# Patient Record
Sex: Male | Born: 1935 | Race: White | Hispanic: No | Marital: Married | Smoking: Never smoker
Health system: Southern US, Community
[De-identification: ages and names within clinical notes are randomized; demographics above are authoritative.]

## PROBLEM LIST (undated history)

## (undated) DIAGNOSIS — C4492 Squamous cell carcinoma of skin, unspecified: Secondary | ICD-10-CM

## (undated) DIAGNOSIS — D229 Melanocytic nevi, unspecified: Secondary | ICD-10-CM

## (undated) DIAGNOSIS — C4491 Basal cell carcinoma of skin, unspecified: Secondary | ICD-10-CM

---
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
## 2008-08-03 ENCOUNTER — Emergency Department (HOSPITAL_BASED_OUTPATIENT_CLINIC_OR_DEPARTMENT_OTHER): Admission: EM | Admit: 2008-08-03 | Discharge: 2008-08-03 | Payer: Self-pay | Admitting: Emergency Medicine

## 2009-02-16 ENCOUNTER — Emergency Department (HOSPITAL_BASED_OUTPATIENT_CLINIC_OR_DEPARTMENT_OTHER): Admission: EM | Admit: 2009-02-16 | Discharge: 2009-02-16 | Payer: Self-pay | Admitting: Emergency Medicine

## 2009-06-30 ENCOUNTER — Ambulatory Visit: Payer: Self-pay | Admitting: Diagnostic Radiology

## 2009-06-30 ENCOUNTER — Emergency Department (HOSPITAL_BASED_OUTPATIENT_CLINIC_OR_DEPARTMENT_OTHER): Admission: EM | Admit: 2009-06-30 | Discharge: 2009-06-30 | Payer: Self-pay | Admitting: Emergency Medicine

## 2009-09-30 ENCOUNTER — Emergency Department (HOSPITAL_BASED_OUTPATIENT_CLINIC_OR_DEPARTMENT_OTHER): Admission: EM | Admit: 2009-09-30 | Discharge: 2009-09-30 | Payer: Self-pay | Admitting: Emergency Medicine

## 2009-09-30 ENCOUNTER — Ambulatory Visit: Payer: Self-pay | Admitting: Radiology

## 2010-06-17 LAB — CBC
HCT: 37 % — ABNORMAL LOW (ref 39.0–52.0)
MCHC: 33.7 g/dL (ref 30.0–36.0)
MCV: 89.1 fL (ref 78.0–100.0)
RBC: 4.15 MIL/uL — ABNORMAL LOW (ref 4.22–5.81)
RDW: 13.6 % (ref 11.5–15.5)
WBC: 4.9 10*3/uL (ref 4.0–10.5)

## 2010-06-17 LAB — DIFFERENTIAL
Basophils Relative: 1 % (ref 0–1)
Eosinophils Absolute: 0.1 10*3/uL (ref 0.0–0.7)
Eosinophils Relative: 1 % (ref 0–5)
Lymphocytes Relative: 23 % (ref 12–46)
Monocytes Relative: 16 % — ABNORMAL HIGH (ref 3–12)

## 2010-06-17 LAB — PROTIME-INR: INR: 2.09 — ABNORMAL HIGH (ref 0.00–1.49)

## 2010-06-20 LAB — CBC
HCT: 39.3 % (ref 39.0–52.0)
MCHC: 33.4 g/dL (ref 30.0–36.0)
Platelets: 235 10*3/uL (ref 150–400)
RBC: 4.25 MIL/uL (ref 4.22–5.81)
WBC: 3.4 10*3/uL — ABNORMAL LOW (ref 4.0–10.5)

## 2010-06-20 LAB — COMPREHENSIVE METABOLIC PANEL
ALT: 19 U/L (ref 0–53)
Alkaline Phosphatase: 82 U/L (ref 39–117)
BUN: 24 mg/dL — ABNORMAL HIGH (ref 6–23)
CO2: 28 mEq/L (ref 19–32)
Creatinine, Ser: 1.2 mg/dL (ref 0.4–1.5)
GFR calc Af Amer: >60 mL/min (ref >60)
GFR calc non Af Amer: 60 mL/min — ABNORMAL LOW (ref >60)
Glucose, Bld: 112 mg/dL — ABNORMAL HIGH (ref 70–99)
Total Protein: 6.7 g/dL (ref 6.0–8.3)

## 2010-06-20 LAB — URINALYSIS, ROUTINE W REFLEX MICROSCOPIC
Nitrite: NEGATIVE
Protein, ur: NEGATIVE mg/dL
Urobilinogen, UA: 0.2 mg/dL (ref 0.0–1.0)

## 2010-06-20 LAB — DIFFERENTIAL: Monocytes Absolute: 0.5 10*3/uL (ref 0.1–1.0)

## 2010-06-20 LAB — URINE MICROSCOPIC-ADD ON

## 2010-07-10 LAB — DIFFERENTIAL
Basophils Absolute: 0.1 10*3/uL (ref 0.0–0.1)
Eosinophils Absolute: 0.1 10*3/uL (ref 0.0–0.7)
Lymphs Abs: 0.9 10*3/uL (ref 0.7–4.0)
Monocytes Relative: 11 % (ref 3–12)
Neutro Abs: 3.9 10*3/uL (ref 1.7–7.7)

## 2010-07-10 LAB — POCT CARDIAC MARKERS
CKMB, poc: 6.2 ng/mL (ref 1.0–8.0)
Troponin i, poc: <0.05 ng/mL (ref 0.00–0.09)

## 2010-07-10 LAB — BASIC METABOLIC PANEL
CO2: 28 mEq/L (ref 19–32)
Calcium: 9 mg/dL (ref 8.4–10.5)
Creatinine, Ser: 1 mg/dL (ref 0.4–1.5)
GFR calc Af Amer: >60 mL/min (ref >60)
GFR calc non Af Amer: >60 mL/min (ref >60)
Glucose, Bld: 133 mg/dL — ABNORMAL HIGH (ref 70–99)
Potassium: 3.9 mEq/L (ref 3.5–5.1)
Sodium: 141 mEq/L (ref 135–145)

## 2010-07-10 LAB — MAGNESIUM: Magnesium: 2 mg/dL (ref 1.5–2.5)

## 2010-07-10 LAB — CBC
Hemoglobin: 16 g/dL (ref 13.0–17.0)
WBC: 5.6 10*3/uL (ref 4.0–10.5)

## 2011-08-05 DIAGNOSIS — C4491 Basal cell carcinoma of skin, unspecified: Secondary | ICD-10-CM

## 2011-08-05 DIAGNOSIS — D229 Melanocytic nevi, unspecified: Secondary | ICD-10-CM

## 2011-08-05 DIAGNOSIS — C4492 Squamous cell carcinoma of skin, unspecified: Secondary | ICD-10-CM

## 2011-08-05 HISTORY — DX: Squamous cell carcinoma of skin, unspecified: C44.92

## 2011-08-05 HISTORY — DX: Basal cell carcinoma of skin, unspecified: C44.91

## 2011-08-05 HISTORY — DX: Melanocytic nevi, unspecified: D22.9

## 2014-07-18 DIAGNOSIS — C4491 Basal cell carcinoma of skin, unspecified: Secondary | ICD-10-CM

## 2014-07-18 HISTORY — DX: Basal cell carcinoma of skin, unspecified: C44.91

## 2014-08-10 DIAGNOSIS — C4491 Basal cell carcinoma of skin, unspecified: Secondary | ICD-10-CM

## 2014-08-10 HISTORY — DX: Basal cell carcinoma of skin, unspecified: C44.91

## 2015-04-24 DIAGNOSIS — C4491 Basal cell carcinoma of skin, unspecified: Secondary | ICD-10-CM

## 2015-04-24 HISTORY — DX: Basal cell carcinoma of skin, unspecified: C44.91

## 2015-08-23 DIAGNOSIS — C4491 Basal cell carcinoma of skin, unspecified: Secondary | ICD-10-CM

## 2015-08-23 HISTORY — DX: Basal cell carcinoma of skin, unspecified: C44.91

## 2018-10-26 ENCOUNTER — Encounter: Payer: Self-pay | Admitting: *Deleted

## 2020-10-13 ENCOUNTER — Emergency Department (HOSPITAL_BASED_OUTPATIENT_CLINIC_OR_DEPARTMENT_OTHER)
Admission: EM | Admit: 2020-10-13 | Discharge: 2020-10-13 | Disposition: A | Discharge status: Home / Self Care | Payer: Medicare Other | Attending: Emergency Medicine | Admitting: Emergency Medicine

## 2020-10-13 ENCOUNTER — Emergency Department (HOSPITAL_BASED_OUTPATIENT_CLINIC_OR_DEPARTMENT_OTHER): Payer: Medicare Other

## 2020-10-13 ENCOUNTER — Encounter (HOSPITAL_BASED_OUTPATIENT_CLINIC_OR_DEPARTMENT_OTHER): Payer: Self-pay | Admitting: *Deleted

## 2020-10-13 ENCOUNTER — Other Ambulatory Visit: Payer: Self-pay

## 2020-10-13 DIAGNOSIS — Z85828 Personal history of other malignant neoplasm of skin: Secondary | ICD-10-CM | POA: Insufficient documentation

## 2020-10-13 DIAGNOSIS — R112 Nausea with vomiting, unspecified: Secondary | ICD-10-CM | POA: Diagnosis present

## 2020-10-13 DIAGNOSIS — R42 Dizziness and giddiness: Secondary | ICD-10-CM | POA: Diagnosis not present

## 2020-10-13 LAB — COMPREHENSIVE METABOLIC PANEL
ALT: 30 U/L (ref 0–44)
AST: 32 U/L (ref 15–41)
Albumin: 4.2 g/dL (ref 3.5–5.0)
Alkaline Phosphatase: 84 U/L (ref 38–126)
Anion gap: 10 (ref 5–15)
BUN: 18 mg/dL (ref 8–23)
CO2: 30 mmol/L (ref 22–32)
Calcium: 8.6 mg/dL — ABNORMAL LOW (ref 8.9–10.3)
Chloride: 93 mmol/L — ABNORMAL LOW (ref 98–111)
Creatinine, Ser: 0.93 mg/dL (ref 0.61–1.24)
GFR, Estimated: >60 mL/min (ref >60)
Glucose, Bld: 137 mg/dL — ABNORMAL HIGH (ref 70–99)
Potassium: 3.3 mmol/L — ABNORMAL LOW (ref 3.5–5.1)
Sodium: 133 mmol/L — ABNORMAL LOW (ref 135–145)
Total Bilirubin: 0.4 mg/dL (ref 0.3–1.2)
Total Protein: 7.3 g/dL (ref 6.5–8.1)

## 2020-10-13 LAB — URINALYSIS, ROUTINE W REFLEX MICROSCOPIC
Bilirubin Urine: NEGATIVE
Glucose, UA: NEGATIVE mg/dL
Ketones, ur: NEGATIVE mg/dL
Leukocytes,Ua: NEGATIVE
Nitrite: NEGATIVE
Protein, ur: NEGATIVE mg/dL
Specific Gravity, Urine: 1.015 (ref 1.005–1.030)
pH: 8 (ref 5.0–8.0)

## 2020-10-13 LAB — CBC WITH DIFFERENTIAL/PLATELET
Abs Immature Granulocytes: 0 10*3/uL (ref 0.00–0.07)
Basophils Absolute: 0 10*3/uL (ref 0.0–0.1)
Basophils Relative: 1 %
Eosinophils Absolute: 0 10*3/uL (ref 0.0–0.5)
Eosinophils Relative: 1 %
HCT: 42 % (ref 39.0–52.0)
Hemoglobin: 14.8 g/dL (ref 13.0–17.0)
Immature Granulocytes: 0 %
Lymphocytes Relative: 15 %
Lymphs Abs: 0.8 10*3/uL (ref 0.7–4.0)
MCH: 31.5 pg (ref 26.0–34.0)
MCHC: 35.2 g/dL (ref 30.0–36.0)
MCV: 89.4 fL (ref 80.0–100.0)
Monocytes Absolute: 0.8 10*3/uL (ref 0.1–1.0)
Monocytes Relative: 14 %
Neutro Abs: 3.7 10*3/uL (ref 1.7–7.7)
Neutrophils Relative %: 69 %
Platelets: 233 10*3/uL (ref 150–400)
RBC: 4.7 MIL/uL (ref 4.22–5.81)
RDW: 12.5 % (ref 11.5–15.5)
WBC: 5.3 10*3/uL (ref 4.0–10.5)
nRBC: 0 % (ref 0.0–0.2)

## 2020-10-13 LAB — URINALYSIS, MICROSCOPIC (REFLEX)

## 2020-10-13 MED ORDER — MECLIZINE HCL 25 MG PO TABS
12.5000 mg | ORAL_TABLET | Freq: Once | ORAL | Status: AC
  Administered 2020-10-13: 12.5 mg via ORAL
  Filled 2020-10-13: qty 1

## 2020-10-13 MED ORDER — MECLIZINE HCL 12.5 MG PO TABS: 12.5000 mg | ORAL_TABLET | Freq: Three times a day (TID) | ORAL | 0 refills | Status: AC | PRN

## 2020-10-13 MED ORDER — ONDANSETRON HCL 4 MG/2ML IJ SOLN
INTRAMUSCULAR | Status: AC
  Administered 2020-10-13: 4 mg via INTRAVENOUS
  Filled 2020-10-13: qty 2

## 2020-10-13 MED ORDER — SODIUM CHLORIDE 0.9 % IV SOLN: 1000.0000 mL | INTRAVENOUS | Status: DC

## 2020-10-13 MED ORDER — ONDANSETRON 8 MG PO TBDP: 8.0000 mg | ORAL_TABLET | Freq: Three times a day (TID) | ORAL | 0 refills | Status: AC | PRN

## 2020-10-13 MED ORDER — ONDANSETRON HCL 4 MG/2ML IJ SOLN: 4.0000 mg | Freq: Once | INTRAMUSCULAR | Status: AC

## 2020-10-13 MED ORDER — SODIUM CHLORIDE 0.9 % IV BOLUS (SEPSIS)
500.0000 mL | Freq: Once | INTRAVENOUS | Status: AC
  Administered 2020-10-13: 500 mL via INTRAVENOUS

## 2020-10-13 NOTE — ED Provider Notes (Signed)
[facility] Provider Note   CSN: [id number] Arrival date & time: 10/13/20  1446     History Chief Complaint  Patient presents with   Dizziness    Philip Henderson is a 84 y.o. male.   Dizziness  Patient presents to the ED with complaints of dizziness.  Patient feels that he is dehydrated.  He states he was working outside all day.  He was out in the heat and became dehydrated.  Normally he tries to drink more fluids at night but he was not able to do that because of his use of CPAP.  He started feeling very poorly today.  He felt that the room was spinning.  He became dizzy and nauseated.  He vomited.  He vomited a few more times and decided to come to the ED.  He is not having any headache.  No trouble with vision.  No trouble with his speech.  Patient at baseline has difficulty with his gait and has to use a walker.  He has not noticed anything different associated with.  No chest pain or shortness of breath.  No abdominal pain.  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   History reviewed. No pertinent surgical history.     No family history on file.  Social History   Tobacco Use   Smoking status: Never   Smokeless tobacco: Never  Substance  Use Topics   Alcohol use: Not Currently   Drug use: Not Currently    Home Medications Prior to Admission medications   Medication Sig Start Date End Date Taking? Authorizing Provider  meclizine (ANTIVERT) 12.5 MG tablet Take 1 tablet (12.5 mg total) by mouth 3 (three) times daily as needed for dizziness. 10/13/20  Yes Dorie Rank, MD  ondansetron (ZOFRAN ODT) 8 MG disintegrating tablet Take 1 tablet (8 mg total) by mouth every 8 (eight) hours as needed for nausea or vomiting. 10/13/20  Yes Dorie Rank, MD    Allergies    Hydrocodone-acetaminophen  Review of Systems   Review of Systems  Neurological:  Positive for dizziness.  All other systems reviewed and are negative.  Physical Exam Updated Vital Signs BP (!) 157/90   Pulse 70   Temp 97.6 F (36.4 C) (Oral)   Resp 17   Ht 1.854 m (6\' 1" )   Wt 122.5 kg   SpO2 97%   BMI 35.62 kg/m   Physical Exam Vitals and nursing note reviewed.  Constitutional:      General: He is not in acute distress.    Appearance: He is well-developed. He is not diaphoretic.  HENT:     Head: Normocephalic and atraumatic.     Right Ear: External ear normal.  Left Ear: External ear normal.  Eyes:     General: No scleral icterus.       Right eye: No discharge.        Left eye: No discharge.     Conjunctiva/sclera: Conjunctivae normal.  Neck:     Trachea: No tracheal deviation.  Cardiovascular:     Rate and Rhythm: Normal rate and regular rhythm.  Pulmonary:     Effort: Pulmonary effort is normal. No respiratory distress.     Breath sounds: Normal breath sounds. No stridor. No wheezing or rales.  Abdominal:     General: Bowel sounds are normal. There is no distension.     Palpations: Abdomen is soft.     Tenderness: There is no abdominal tenderness. There is no guarding or rebound.  Musculoskeletal:        General: No tenderness or deformity.     Cervical back: Neck supple.  Skin:    General: Skin is warm and dry.     Findings: No rash.   Neurological:     General: No focal deficit present.     Mental Status: He is alert and oriented to person, place, and time.     Cranial Nerves: No cranial nerve deficit (no facial droop, extraocular movements intact, no slurred speech).     Sensory: No sensory deficit.     Motor: No abnormal muscle tone or seizure activity.     Coordination: Coordination normal.     Comments: No pronator drift bilateral upper extrem, able to hold both legs off bed for 5 seconds, sensation intact in all extremities, no visual field cuts, no left or right sided neglect, normal finger-nose exam bilaterally, no nystagmus noted   Psychiatric:        Mood and Affect: Mood normal.    ED Results / Procedures / Treatments   Labs (all labs ordered are listed, but only abnormal results are displayed) Labs Reviewed  COMPREHENSIVE METABOLIC PANEL - Abnormal; Notable for the following components:      Result Value   Sodium 133 (*)    Potassium 3.3 (*)    Chloride 93 (*)    Glucose, Bld 137 (*)    Calcium 8.6 (*)    All other components within normal limits  URINALYSIS, ROUTINE W REFLEX MICROSCOPIC - Abnormal; Notable for the following components:   APPearance HAZY (*)    Hgb urine dipstick TRACE (*)    All other components within normal limits  URINALYSIS, MICROSCOPIC (REFLEX) - Abnormal; Notable for the following components:   Bacteria, UA MANY (*)    All other components within normal limits  CBC WITH DIFFERENTIAL/PLATELET    EKG None  Radiology CT Head Wo Contrast  Result Date: 10/13/2020 CLINICAL DATA:  Dizziness and nausea EXAM: CT HEAD WITHOUT CONTRAST TECHNIQUE: Contiguous axial images were obtained from the base of the skull through the vertex without intravenous contrast. COMPARISON:  CT brain report 08/07/2001 FINDINGS: Brain: No acute territorial infarction, hemorrhage or intracranial mass. Mild atrophy. Mild chronic small vessel ischemic changes of the white matter. Nonenlarged ventricles  Vascular: Vertebral and carotid vascular calcification Skull: Normal. Negative for fracture or focal lesion. Sinuses/Orbits: No acute finding. Other: None IMPRESSION: 1. No CT evidence for acute intracranial abnormality. 2. Mild atrophy and chronic small vessel ischemic changes of the white matter Electronically Signed   By: Donavan Foil M.D.   On: 10/13/2020 19:25    Procedures Procedures   Medications Ordered in ED Medications  sodium chloride  0.9 % bolus 500 mL (0 mLs Intravenous Stopped 10/13/20 1822)    Followed by  0.9 %  sodium chloride infusion (has no administration in time range)  meclizine (ANTIVERT) tablet 12.5 mg (0 mg Oral Hold 10/13/20 1654)  ondansetron (ZOFRAN) injection 4 mg (4 mg Intravenous Given 10/13/20 1824)    ED Course  I have reviewed the triage vital signs and the nursing notes.  Pertinent labs & imaging results that were available during my care of the patient were reviewed by me and considered in my medical decision making (see chart for details).  Clinical Course as of 10/13/20 2020  Fri Oct 13, 2020  1758 Labs reviewed.  CBC and metabolic panel unremarkable.  Urinalysis shows many bacteria but patient is not having any urinary symptoms. [UT]  6546 CT scan ordered earlier because of pain strings complaints of dizziness however he opted not to do that test. [JK]  1759 he has been given IV fluids.  He is feeling much better now.  We will have him walk around to make sure he is not feeling unsteady. [TK]  3546 Patient attempted to ambulate.  He started vomiting again. [JK]  1946 Head CT without acute findings [JK]    Clinical Course User Index [JK] Dorie Rank, MD   MDM Rules/Calculators/A&P                          Patient presented to the ED with complaints of dizziness and vomiting.  Patient felt the symptoms were related to dehydration and had been working out in the heat yesterday.  I was concerned that his symptoms sounded more vertiginous in nature.   Patient however did not want treatment initially for that and just wanted IV fluids.  Fortunately no signs of severe dehydration.  No signs of anemia.  Patient felt well at rest and he was getting ready to be discharged but when he sat up he started having vomiting again.  Had CT scan performed that does not show any acute findings.  Patient still did not take any meclizine as he was feeling fine when he was still.  I think his symptoms are most likely related to peripheral vertigo.  I doubt stroke at this time.  Patient would like to go home.  Will rx zofran and meclizine. Final Clinical Impression(s) / ED Diagnoses Final diagnoses:  Vertigo  Nausea and vomiting, intractability of vomiting not specified, unspecified vomiting type    Rx / DC Orders ED Discharge Orders          Ordered    meclizine (ANTIVERT) 12.5 MG tablet  3 times daily PRN        10/13/20 2019    ondansetron (ZOFRAN ODT) 8 MG disintegrating tablet  Every 8 hours PRN        10/13/20 2019             Dorie Rank, MD 10/13/20 2022

## 2020-10-13 NOTE — Discharge Instructions (Addendum)
Take the medications to help with the dizziness and vomiting.  Return to the emergency room at [facility] or [facility] if your symptoms return.

## 2020-10-13 NOTE — ED Notes (Signed)
Pt resting comfortably; denies nausea at this time.

## 2020-10-13 NOTE — ED Notes (Signed)
Pt ambulated in hallway with walker; no dizziness while ambulatory, but upon sitting for a few minutes, pt vomited. EDP aware, orders received.

## 2020-10-13 NOTE — ED Triage Notes (Signed)
C/o dizziness and nausea x 1 day, reports " im dehydrated from working in yard all day"

## 2020-10-13 NOTE — ED Notes (Signed)
Pt sitting up on side of bed

## 2020-10-13 NOTE — ED Notes (Signed)
CT on hold per pt's request; pt would like to see how he feels after having fluids; RN to speak w/ MD

## 2022-08-13 ENCOUNTER — Emergency Department (HOSPITAL_BASED_OUTPATIENT_CLINIC_OR_DEPARTMENT_OTHER): Payer: Medicare Other

## 2022-08-13 ENCOUNTER — Other Ambulatory Visit: Payer: Self-pay

## 2022-08-13 ENCOUNTER — Emergency Department (HOSPITAL_BASED_OUTPATIENT_CLINIC_OR_DEPARTMENT_OTHER)
Admission: EM | Admit: 2022-08-13 | Discharge: 2022-08-13 | Disposition: A | Discharge status: Home / Self Care | Payer: Medicare Other | Attending: Emergency Medicine | Admitting: Emergency Medicine

## 2022-08-13 DIAGNOSIS — Z950 Presence of cardiac pacemaker: Secondary | ICD-10-CM | POA: Insufficient documentation

## 2022-08-13 DIAGNOSIS — S2231XA Fracture of one rib, right side, initial encounter for closed fracture: Secondary | ICD-10-CM

## 2022-08-13 DIAGNOSIS — N189 Chronic kidney disease, unspecified: Secondary | ICD-10-CM | POA: Diagnosis not present

## 2022-08-13 DIAGNOSIS — Z7901 Long term (current) use of anticoagulants: Secondary | ICD-10-CM | POA: Insufficient documentation

## 2022-08-13 DIAGNOSIS — M25511 Pain in right shoulder: Secondary | ICD-10-CM | POA: Diagnosis present

## 2022-08-13 DIAGNOSIS — W228XXA Striking against or struck by other objects, initial encounter: Secondary | ICD-10-CM | POA: Diagnosis not present

## 2022-08-13 LAB — PROTIME-INR
INR: 2.1 — ABNORMAL HIGH (ref 0.8–1.2)
Prothrombin Time: 23.8 seconds — ABNORMAL HIGH (ref 11.4–15.2)

## 2022-08-13 MED ORDER — PROCHLORPERAZINE EDISYLATE 10 MG/2ML IJ SOLN: 10.0000 mg | Freq: Once | INTRAMUSCULAR | Status: DC

## 2022-08-13 NOTE — Discharge Instructions (Signed)
You have fractured your third right rib, I have given you a incentive spirometer please use 3 times a day for next 3 weeks, this will help prevent pneumonia.  Please continue with all your home medications.  I would like you to follow-up with your primary doctor and or pulmonology next 2 weeks time for reassessment.  Come back to the emergency department if you develop chest pain, shortness of breath, severe abdominal pain, uncontrolled nausea, vomiting, diarrhea.

## 2022-08-13 NOTE — ED Notes (Signed)
Patient transported to CT 

## 2022-08-13 NOTE — ED Notes (Signed)
Unsuccessful lab draw.  2nd RN asked to attempt.
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
## 2022-08-13 NOTE — ED Notes (Signed)
Pt verbalized understanding of discharge instructions. Opportunity for questions provided.  
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
## 2023-05-05 IMAGING — CT CT HEAD W/O CM
3 series · 16 of 47 positions shown, 19 images · non-contrast
Comparison: CT brain report 08/07/2001

CLINICAL DATA: Dizziness and nausea

EXAM:
CT HEAD WITHOUT CONTRAST
TECHNIQUE: Contiguous axial images were obtained from the base of the skull
through the vertex without intravenous contrast.

[Series 2: head wo · axial · 0.45mm/px · z∈[-160,-35]mm · 10 of 31 slices shown, 13 images]
[im 3/31  brain]
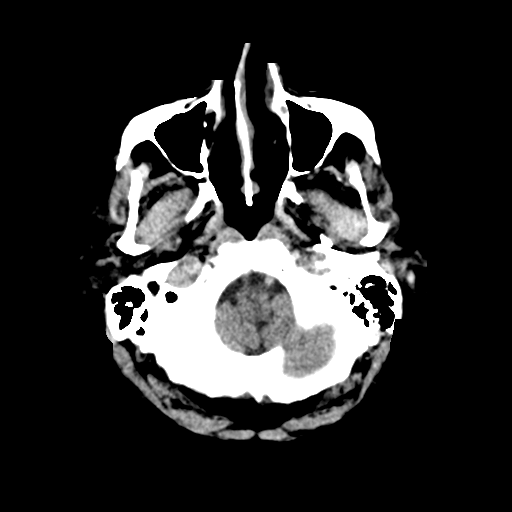
[im 3/31  bone]
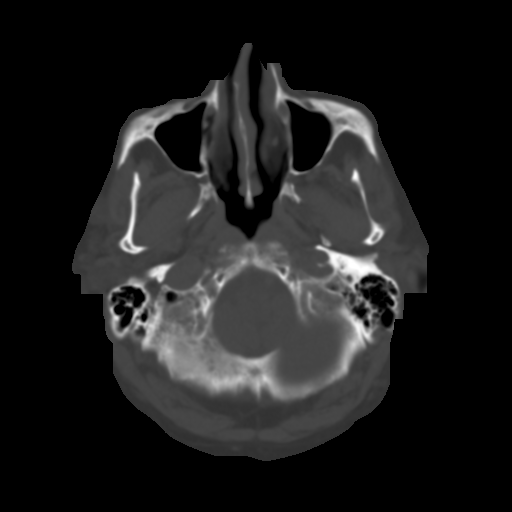
[im 6/31  brain]
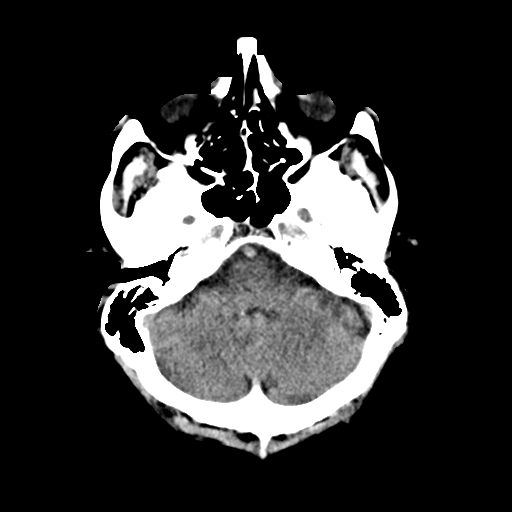
[im 9/31  brain]
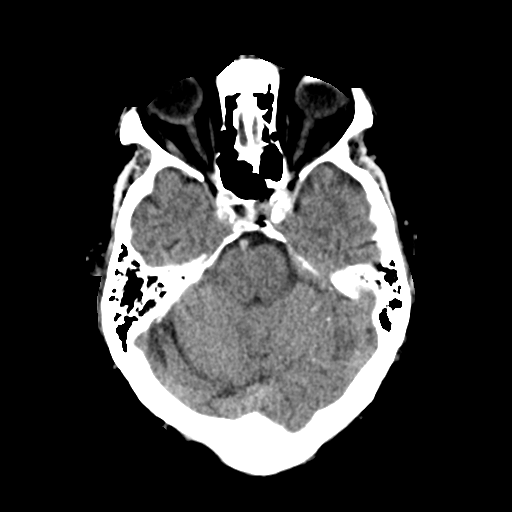
[im 11/31  brain]
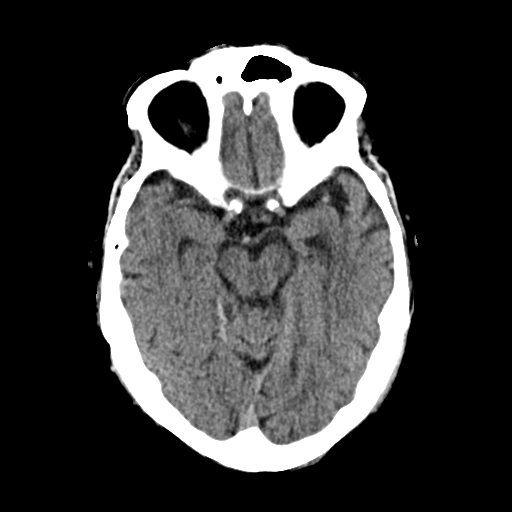
[im 14/31  brain]
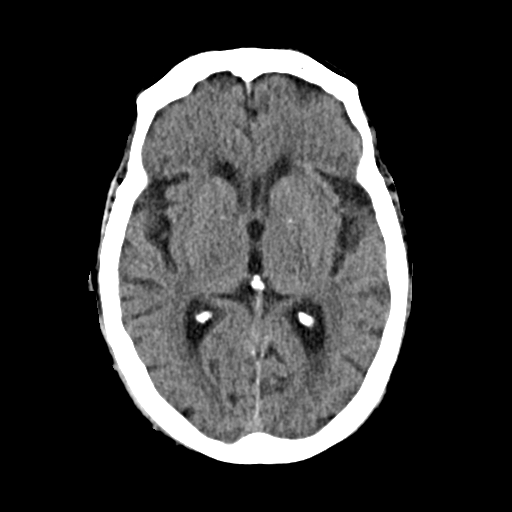
[im 14/31  bone]
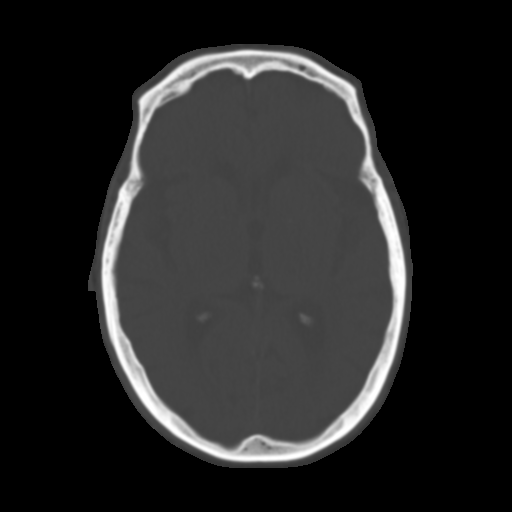
[im 17/31  brain]
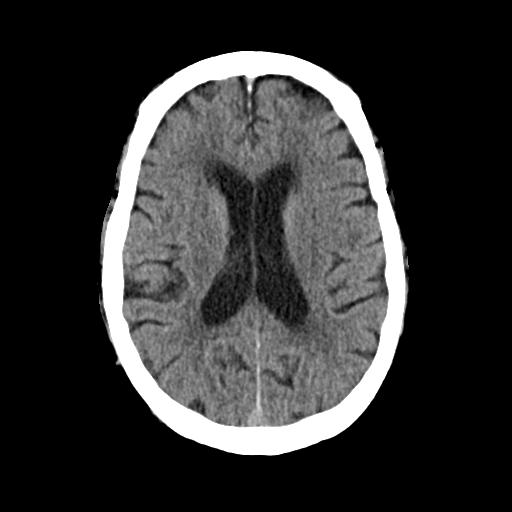
[im 20/31  brain]
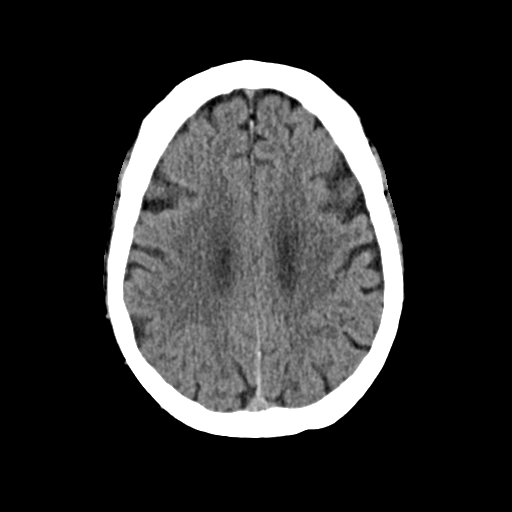
[im 23/31  brain]
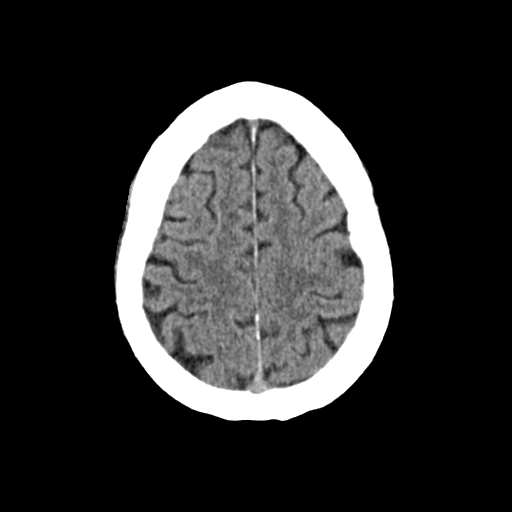
[im 25/31  brain]
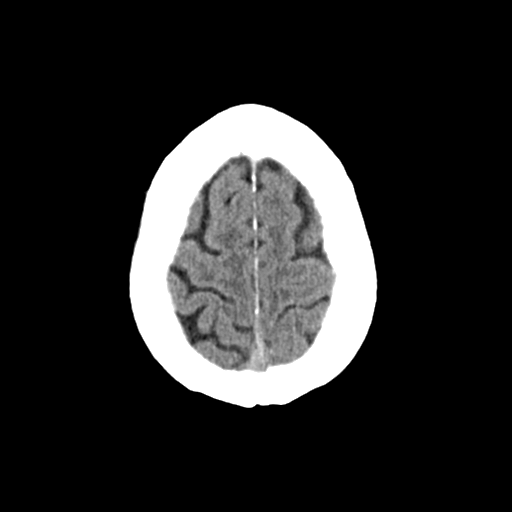
[im 25/31  bone]
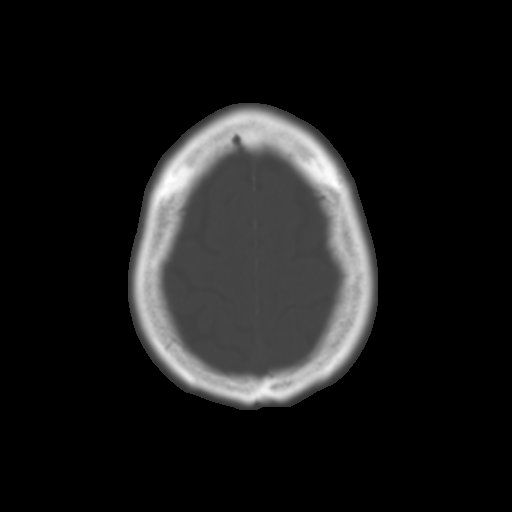
[im 28/31  brain]
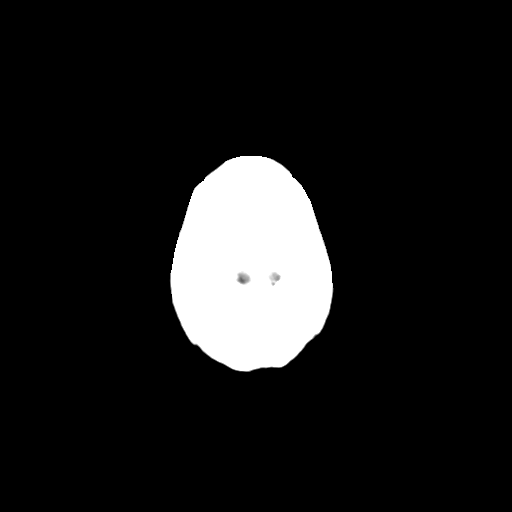

[Series 4: coronal soft · coronal · 0.31mm/px · 3 of 70 slices shown]
[im 24/70  brain]
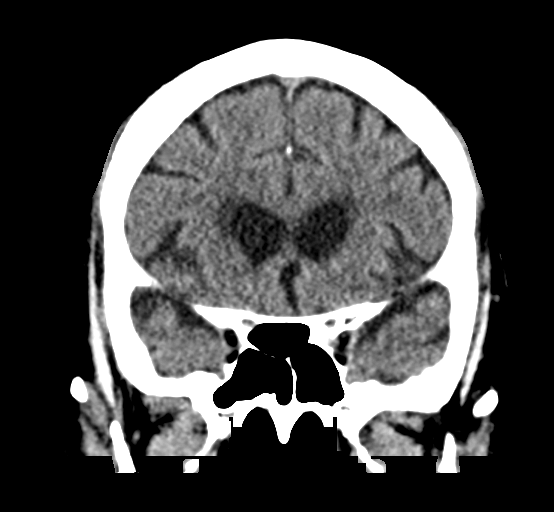
[im 31/70  brain]
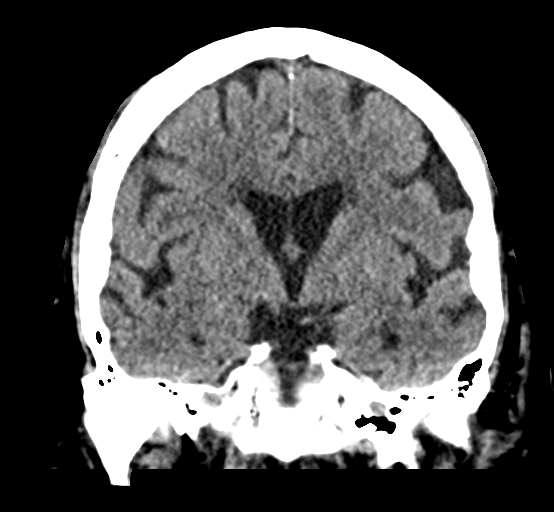
[im 39/70  brain]
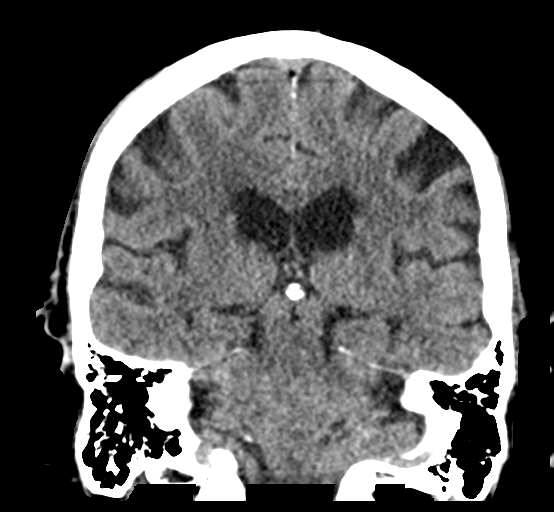

[Series 5: sag soft · sagittal · 0.31mm/px · 3 of 57 slices shown]
[im 19/57  brain]
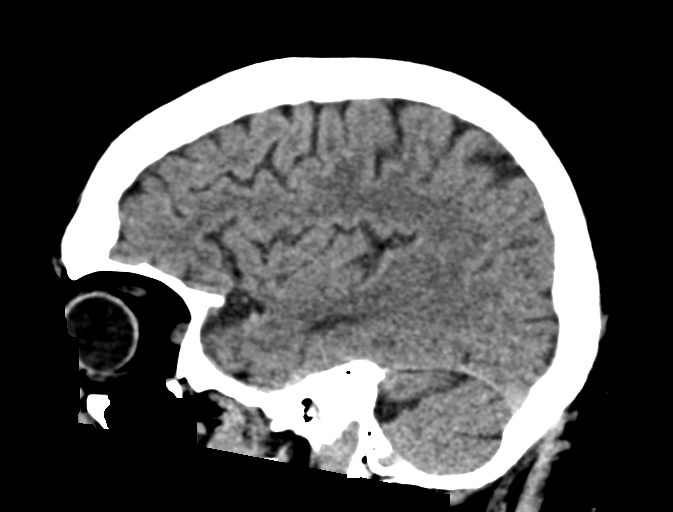
[im 29/57  brain]
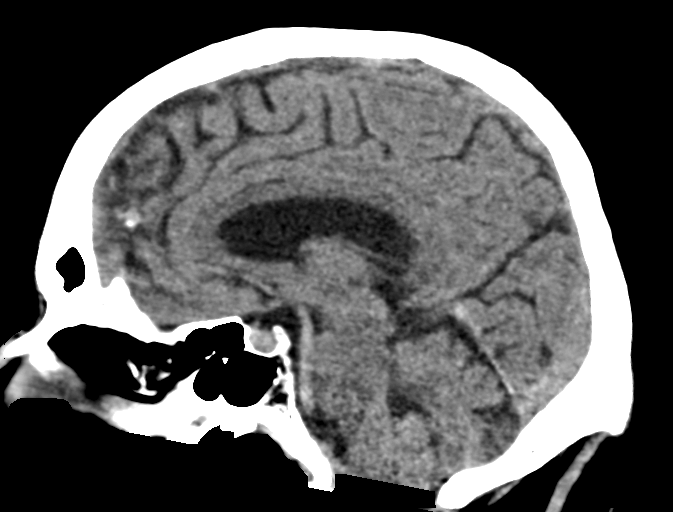
[im 38/57  brain]
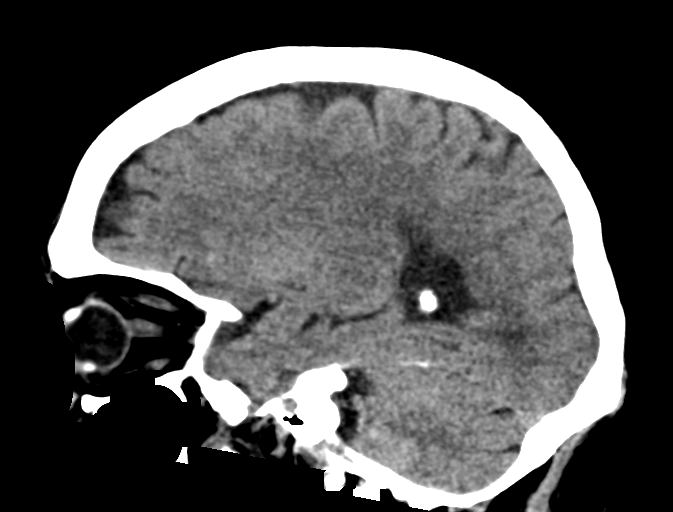

[16 of 47 positions shown; findings below may reference images not displayed]

FINDINGS: Brain: No acute territorial infarction, hemorrhage or intracranial
mass. Mild atrophy. Mild chronic small vessel ischemic changes of
the white matter. Nonenlarged ventricles

Vascular: Vertebral and carotid vascular calcification

Skull: Normal. Negative for fracture or focal lesion.

Sinuses/Orbits: No acute finding.

Other: None
IMPRESSION: 1. No CT evidence for acute intracranial abnormality.
2. Mild atrophy and chronic small vessel ischemic changes of the
white matter

## 2024-01-31 DEATH — deceased
